# Patient Record
Sex: Female | Born: 1985 | Race: White | Hispanic: Yes | Marital: Married | Smoking: Never smoker
Health system: Southern US, Community
[De-identification: ages and names within clinical notes are randomized; demographics above are authoritative.]

## PROBLEM LIST (undated history)

## (undated) ENCOUNTER — Ambulatory Visit: Admission: EM | Payer: Self-pay

---

## 2003-02-17 ENCOUNTER — Other Ambulatory Visit: Admission: RE | Admit: 2003-02-17 | Discharge: 2003-02-17 | Payer: Self-pay | Admitting: Obstetrics and Gynecology

## 2003-06-28 ENCOUNTER — Inpatient Hospital Stay (HOSPITAL_COMMUNITY): Admission: AD | Admit: 2003-06-28 | Discharge: 2003-06-28 | Payer: Self-pay | Admitting: *Deleted

## 2003-07-05 ENCOUNTER — Encounter: Admission: RE | Admit: 2003-07-05 | Discharge: 2003-07-05 | Payer: Self-pay | Admitting: *Deleted

## 2003-07-07 ENCOUNTER — Encounter (INDEPENDENT_AMBULATORY_CARE_PROVIDER_SITE_OTHER): Payer: Self-pay | Admitting: Specialist

## 2003-07-07 ENCOUNTER — Inpatient Hospital Stay (HOSPITAL_COMMUNITY): Admission: AD | Admit: 2003-07-07 | Discharge: 2003-07-09 | Payer: Self-pay | Admitting: Obstetrics & Gynecology

## 2006-01-06 ENCOUNTER — Ambulatory Visit: Payer: Self-pay | Admitting: Family Medicine

## 2006-01-07 ENCOUNTER — Ambulatory Visit: Payer: Self-pay | Admitting: *Deleted

## 2006-01-20 ENCOUNTER — Ambulatory Visit: Payer: Self-pay | Admitting: Family Medicine

## 2006-02-18 ENCOUNTER — Ambulatory Visit: Payer: Self-pay | Admitting: Family Medicine

## 2006-05-06 ENCOUNTER — Encounter (INDEPENDENT_AMBULATORY_CARE_PROVIDER_SITE_OTHER): Payer: Self-pay | Admitting: Family Medicine

## 2006-05-06 ENCOUNTER — Ambulatory Visit: Payer: Self-pay | Admitting: Family Medicine

## 2006-12-31 ENCOUNTER — Encounter (INDEPENDENT_AMBULATORY_CARE_PROVIDER_SITE_OTHER): Payer: Self-pay | Admitting: *Deleted

## 2007-04-22 ENCOUNTER — Ambulatory Visit: Payer: Self-pay | Admitting: Internal Medicine

## 2007-05-13 ENCOUNTER — Ambulatory Visit: Payer: Self-pay | Admitting: Family Medicine

## 2007-05-13 ENCOUNTER — Encounter: Payer: Self-pay | Admitting: Family Medicine

## 2007-08-10 ENCOUNTER — Ambulatory Visit: Payer: Self-pay | Admitting: Internal Medicine

## 2007-08-11 ENCOUNTER — Encounter: Payer: Self-pay | Admitting: Family Medicine

## 2007-08-12 ENCOUNTER — Ambulatory Visit: Payer: Self-pay | Admitting: Family Medicine

## 2007-10-05 ENCOUNTER — Encounter: Payer: Self-pay | Admitting: Family Medicine

## 2007-10-05 ENCOUNTER — Ambulatory Visit: Payer: Self-pay | Admitting: Internal Medicine

## 2007-10-05 LAB — CONVERTED CEMR LAB
AST: 20 units/L (ref 0–37)
Alkaline Phosphatase: 66 units/L (ref 39–117)
BUN: 17 mg/dL (ref 6–23)
Basophils Relative: 0 % (ref 0–1)
Eosinophils Absolute: 0.1 10*3/uL (ref 0.0–0.7)
Eosinophils Relative: 1 % (ref 0–5)
Glucose, Bld: 99 mg/dL (ref 70–99)
HCT: 42.8 % (ref 36.0–46.0)
HDL: 40 mg/dL (ref >39)
LDL Cholesterol: 63 mg/dL (ref 0–99)
Lymphs Abs: 2 10*3/uL (ref 0.7–4.0)
MCHC: 30.8 g/dL (ref 30.0–36.0)
MCV: 87.7 fL (ref 78.0–100.0)
Monocytes Relative: 6 % (ref 3–12)
Platelets: 214 10*3/uL (ref 150–400)
RBC: 4.88 M/uL (ref 3.87–5.11)
Total Bilirubin: 0.7 mg/dL (ref 0.3–1.2)
Total CHOL/HDL Ratio: 3.2
Triglycerides: 124 mg/dL (ref <150)
VLDL: 25 mg/dL (ref 0–40)
WBC: 8.7 10*3/uL (ref 4.0–10.5)

## 2007-10-12 ENCOUNTER — Ambulatory Visit: Payer: Self-pay | Admitting: Internal Medicine

## 2007-10-19 ENCOUNTER — Ambulatory Visit (HOSPITAL_COMMUNITY): Admission: RE | Admit: 2007-10-19 | Discharge: 2007-10-19 | Payer: Self-pay | Admitting: Family Medicine

## 2007-12-24 ENCOUNTER — Emergency Department (HOSPITAL_COMMUNITY): Admission: EM | Admit: 2007-12-24 | Discharge: 2007-12-24 | Payer: Self-pay | Admitting: Family Medicine

## 2008-02-08 ENCOUNTER — Ambulatory Visit: Payer: Self-pay | Admitting: Internal Medicine

## 2008-10-09 ENCOUNTER — Emergency Department (HOSPITAL_COMMUNITY): Admission: EM | Admit: 2008-10-09 | Discharge: 2008-10-09 | Payer: Self-pay | Admitting: Emergency Medicine

## 2008-10-09 ENCOUNTER — Emergency Department (HOSPITAL_COMMUNITY): Admission: EM | Admit: 2008-10-09 | Discharge: 2008-10-09 | Payer: Self-pay | Admitting: Family Medicine

## 2009-01-02 ENCOUNTER — Encounter: Payer: Self-pay | Admitting: Family Medicine

## 2009-01-02 ENCOUNTER — Ambulatory Visit: Payer: Self-pay | Admitting: Internal Medicine

## 2009-01-02 LAB — CONVERTED CEMR LAB: Chlamydia, DNA Probe: NEGATIVE

## 2009-04-11 ENCOUNTER — Ambulatory Visit: Payer: Self-pay | Admitting: Internal Medicine

## 2009-05-10 ENCOUNTER — Ambulatory Visit: Payer: Self-pay | Admitting: Internal Medicine

## 2009-05-22 ENCOUNTER — Ambulatory Visit: Payer: Self-pay | Admitting: Internal Medicine

## 2010-05-07 ENCOUNTER — Encounter: Payer: Self-pay | Admitting: Family Medicine

## 2010-07-23 LAB — URINALYSIS, ROUTINE W REFLEX MICROSCOPIC
Bilirubin Urine: NEGATIVE
Glucose, UA: NEGATIVE mg/dL
Ketones, ur: 15 mg/dL — AB
Leukocytes, UA: NEGATIVE
Specific Gravity, Urine: 1.036 — ABNORMAL HIGH (ref 1.005–1.030)
pH: 7 (ref 5.0–8.0)

## 2010-07-23 LAB — POCT URINALYSIS DIP (DEVICE)
Bilirubin Urine: NEGATIVE
Glucose, UA: NEGATIVE mg/dL
Nitrite: NEGATIVE
pH: 7 (ref 5.0–8.0)

## 2010-07-23 LAB — URINE MICROSCOPIC-ADD ON

## 2010-07-23 LAB — GC/CHLAMYDIA PROBE AMP, GENITAL: Chlamydia, DNA Probe: NEGATIVE

## 2010-07-23 LAB — WET PREP, GENITAL: Trich, Wet Prep: NONE SEEN

## 2010-11-30 IMAGING — CT CT PELVIS W/O CM
2 of 4 series · 14 of 32 positions shown, 19 images · non-contrast
Comparison: None

CT ABDOMEN

CLINICAL DATA: Right flank pain.

CT ABDOMEN AND PELVIS WITHOUT CONTRAST
TECHNIQUE: Multidetector CT imaging of the abdomen and pelvis was
performed following the standard protocol without intravenous
contrast.

[Series 2: >200 lbs stone · axial · 0.79mm/px · z∈[-391,-41]mm · 8 of 91 slices shown, 13 images]
[im 11/91  soft-tissue]
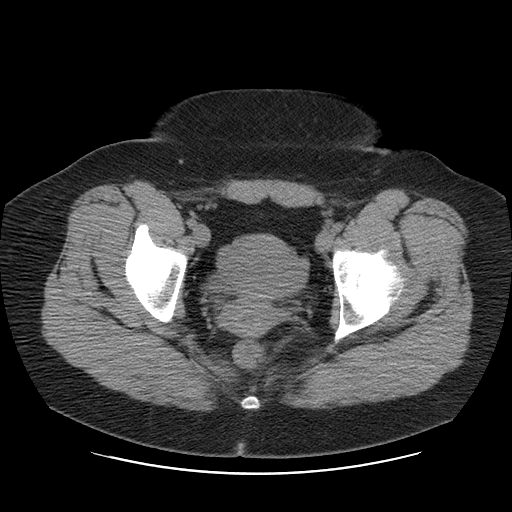
[im 11/91  bone]
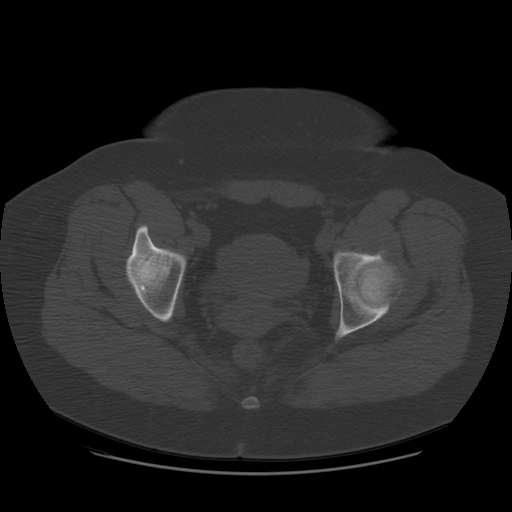
[im 21/91  soft-tissue]
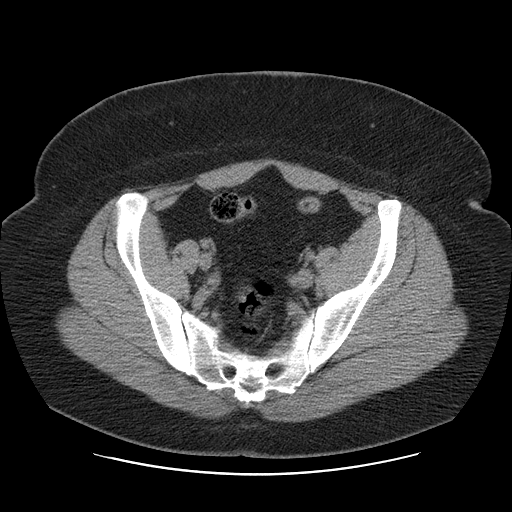
[im 31/91  soft-tissue]
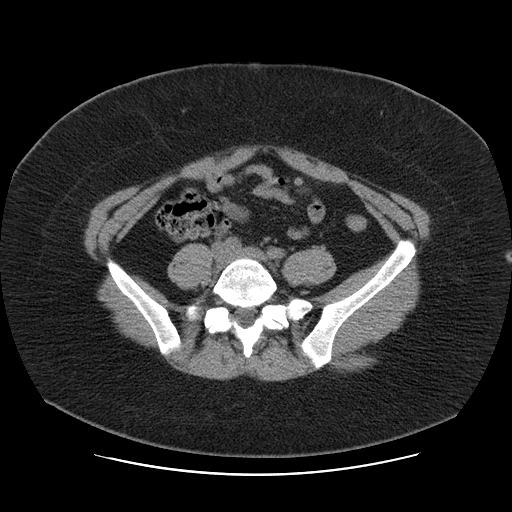
[im 41/91  soft-tissue]
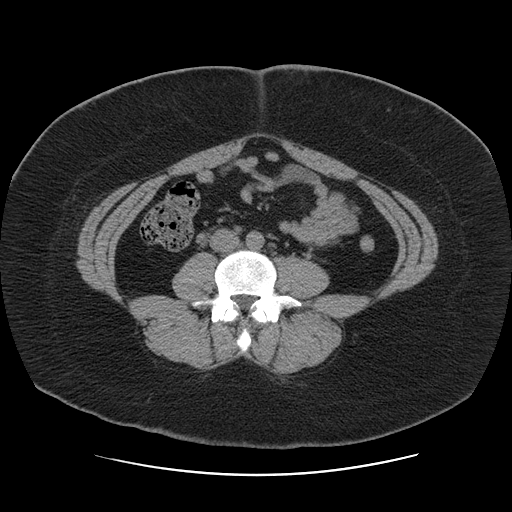
[im 51/91  soft-tissue]
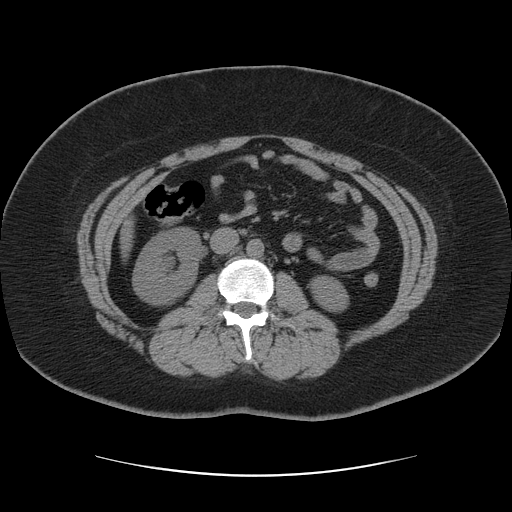
[im 51/91  lung]
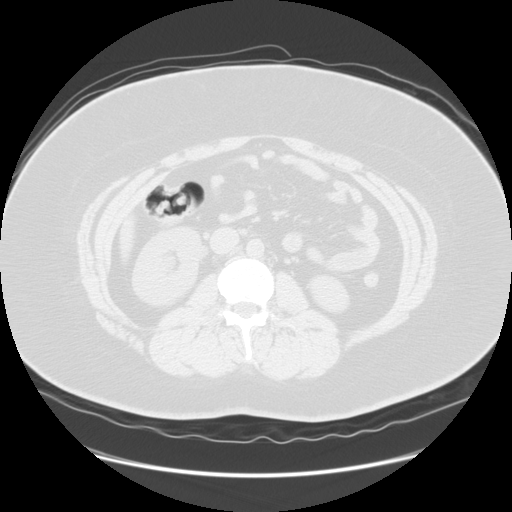
[im 61/91  soft-tissue]
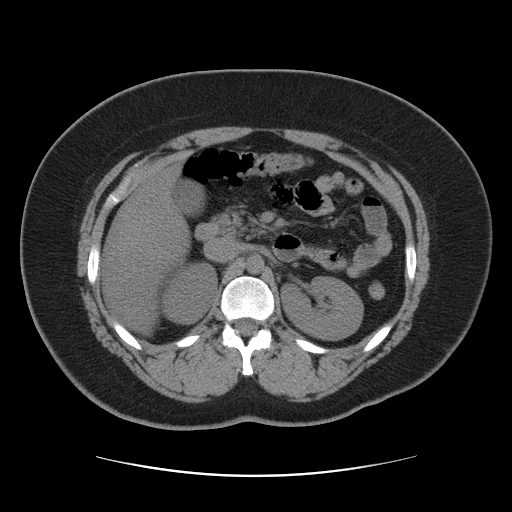
[im 61/91  lung]
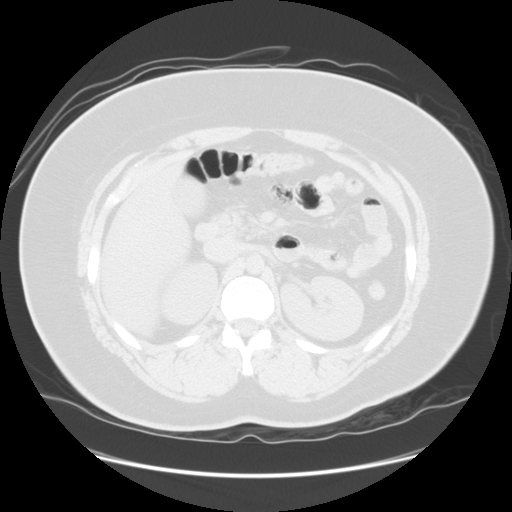
[im 71/91  soft-tissue]
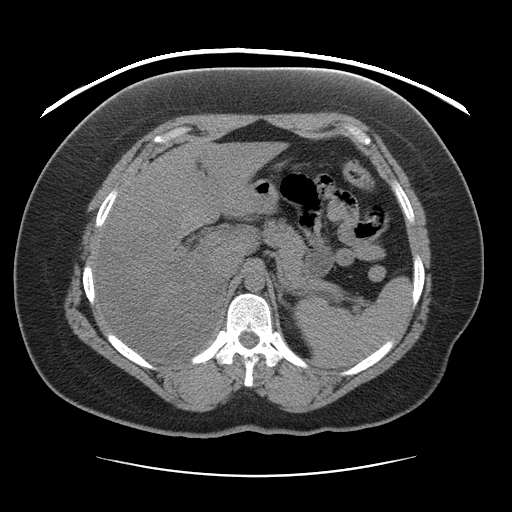
[im 71/91  lung]
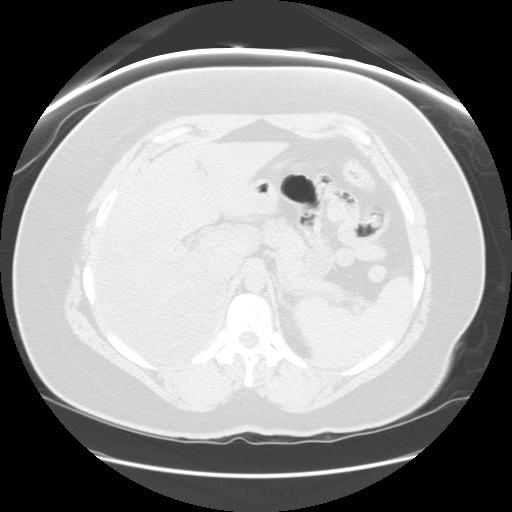
[im 81/91  soft-tissue]
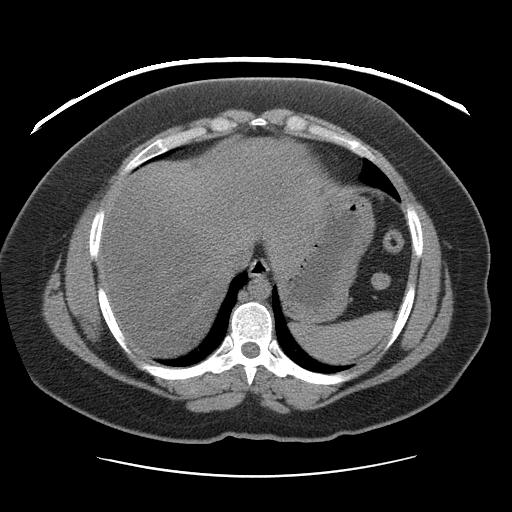
[im 81/91  lung]
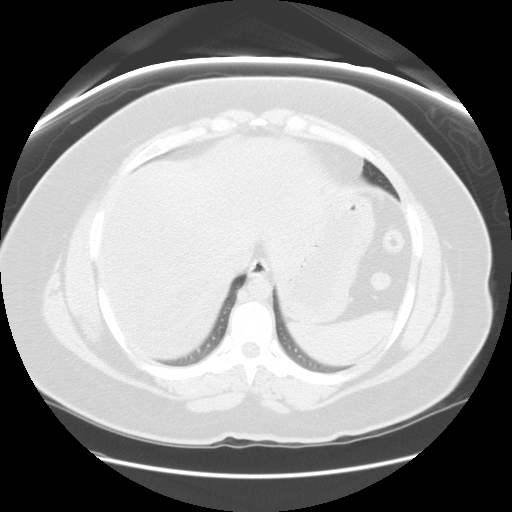

[Series 400: sag · sagittal · 0.91mm/px · 6 of 104 slices shown]
[im 11/104  soft-tissue]
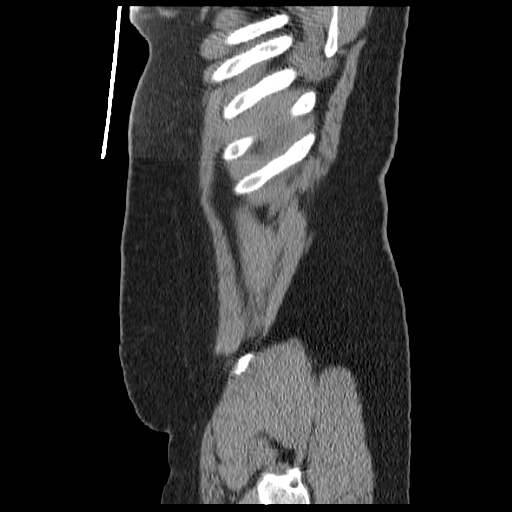
[im 21/104  soft-tissue]
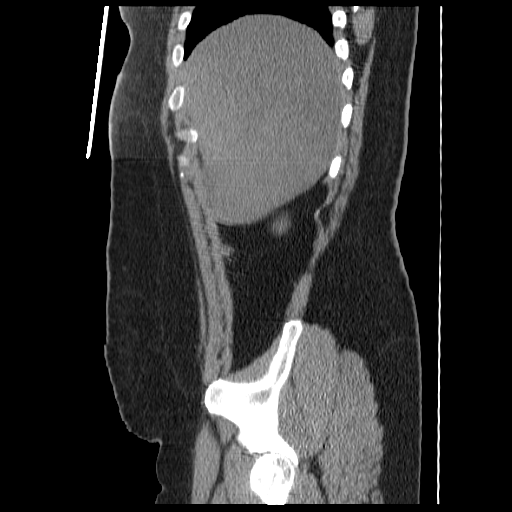
[im 31/104  soft-tissue]
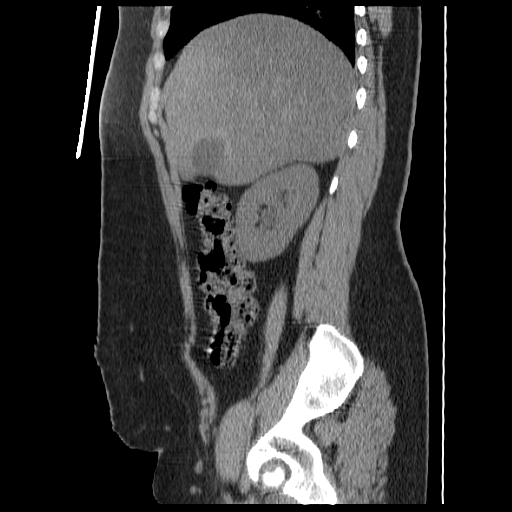
[im 42/104  soft-tissue]
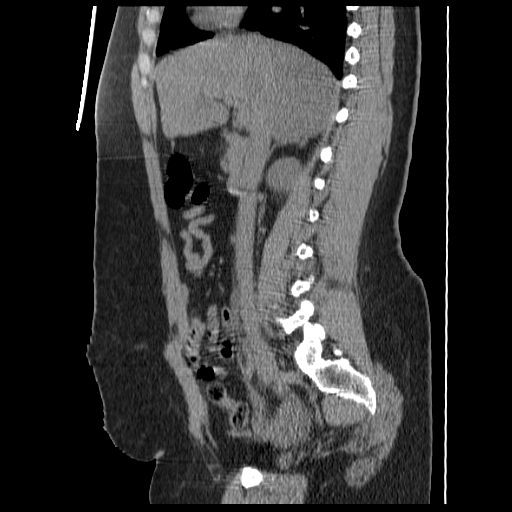
[im 62/104  soft-tissue]
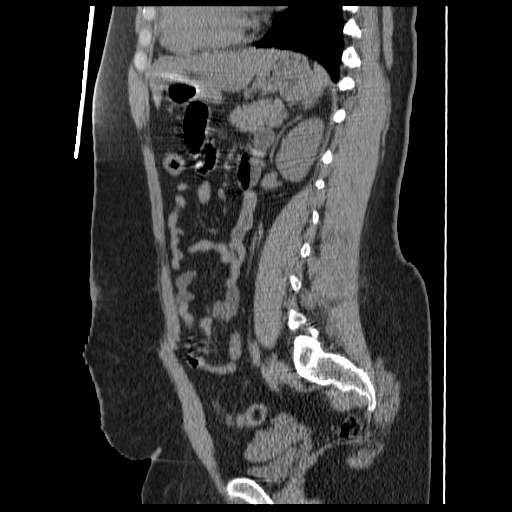
[im 73/104  soft-tissue]
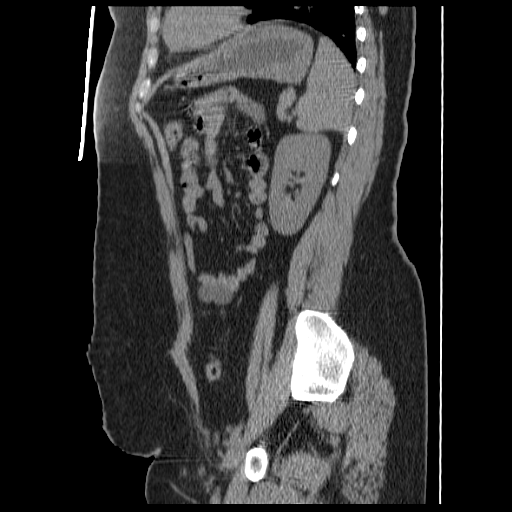

[14 of 32 positions shown; findings below may reference images not displayed]

FINDINGS: The lung bases are clear.  There is geographic fatty
infiltration of the liver.  The spleen is normal in size.  The
pancreas is unremarkable.  The adrenal glands and left kidney are
normal.  The right kidney demonstrates moderate hydronephrosis and
perinephric signs of obstruction.  Small right renal calculi are
noted.

The stomach, duodenum, small bowel and colon grossly normal.  The
study is limited without oral contrast.  No mesenteric or
retroperitoneal masses or adenopathy.  The bony structures are
unremarkable.
IMPRESSION: 1.  Moderate to high-grade obstructive findings involving the right
kidney.  No upper ureteral calculi are seen.
2.  Small lower pole right renal calculus.
3.  Geographic fatty infiltration of the liver.

CT PELVIS
FINDINGS: There is a 5 x 3 mm right distal ureteral calculus
causing the right-sided hydroureteronephrosis.  No bladder calculi.
The uterus and ovaries are unremarkable.  The appendix is
visualized and is normal.  No pelvic mass or adenopathy.  Small
inguinal lymph nodes bilaterally.  The bony pelvis is intact.
IMPRESSION: 5 x 3 mm right distal ureteral calculus causing right-sided
hydroureteronephrosis.

## 2011-01-16 LAB — POCT URINALYSIS DIP (DEVICE)
Glucose, UA: NEGATIVE
Nitrite: NEGATIVE
Operator id: 239701
Protein, ur: NEGATIVE
Specific Gravity, Urine: 1.015
Urobilinogen, UA: 0.2

## 2015-05-08 ENCOUNTER — Ambulatory Visit: Payer: Self-pay

## 2019-12-03 ENCOUNTER — Encounter: Payer: Self-pay | Admitting: Emergency Medicine

## 2019-12-03 ENCOUNTER — Emergency Department
Admission: EM | Admit: 2019-12-03 | Discharge: 2019-12-03 | Disposition: A | Discharge status: Home / Self Care | Payer: Self-pay | Source: Home / Self Care | Attending: Family Medicine | Admitting: Family Medicine

## 2019-12-03 ENCOUNTER — Other Ambulatory Visit: Payer: Self-pay

## 2019-12-03 DIAGNOSIS — S61012A Laceration without foreign body of left thumb without damage to nail, initial encounter: Secondary | ICD-10-CM

## 2019-12-03 NOTE — Discharge Instructions (Addendum)
Keep wound clean and dry.  Return for any signs of infection (or follow-up with family doctor):  Increasing redness, swelling, pain, heat, drainage, etc. °Follow instructions on Dermabond information sheet.  °

## 2019-12-03 NOTE — ED Provider Notes (Signed)
[facility]    CSN: [id number] Arrival date & time: 12/03/19  0909      History   Chief Complaint Chief Complaint  Patient presents with  . Extremity Laceration    HPI Eulia Hatcher is a 34 y.o. female.   While washing dishes today patient lacerated the tip of her right thumb with a knife.  The history is provided by the patient. The history is limited by a language barrier. A language interpreter was used.  Laceration Location:  Finger Finger laceration location:  R thumb Length:  1.5cm Depth:  Cutaneous Quality: straight   Bleeding: controlled   Time since incident:  2 hours Laceration mechanism:  Knife Pain details:    Quality:  Aching   Severity:  Mild   Timing:  Constant   Progression:  Improving Foreign body present:  No foreign bodies Worsened by:  Nothing Ineffective treatments:  None tried Associated symptoms: no numbness and no swelling     History reviewed. No pertinent past medical history.  There are no problems to display for this patient.   History reviewed. No pertinent surgical history.  OB History   No obstetric history on file.      Home Medications    Prior to Admission medications   Not on File    Family History No family history on file.  Social History Social History   Tobacco Use  . Smoking status: Never Smoker  . Smokeless tobacco: Never Used  Substance Use Topics  . Alcohol use: Not on file  . Drug use: Not on file     Allergies   Patient has no known allergies.   Review of Systems Review of Systems  Skin: Positive for wound.  All other systems reviewed and are negative.    Physical Exam Triage Vital Signs ED Triage Vitals  Enc Vitals Group     BP 12/03/19 0927 129/83     Pulse Rate 12/03/19 0927 68     Resp 12/03/19 0927 16     Temp 12/03/19 0927 98.2 F (36.8 C)     Temp Source 12/03/19 0927 Oral     SpO2 12/03/19 0927 99 %     Weight --      Height --      Head Circumference  --      Peak Flow --      Pain Score 12/03/19 0928 7     Pain Loc --      Pain Edu? --      Excl. in GC? --    No data found.  Updated Vital Signs BP 129/83   Pulse 68   Temp 98.2 F (36.8 C) (Oral)   Resp 16   LMP 11/27/2019   SpO2 99%   Visual Acuity Right Eye Distance:   Left Eye Distance:   Bilateral Distance:    Right Eye Near:   Left Eye Near:    Bilateral Near:     Physical Exam Vitals and nursing note reviewed.  Constitutional:      General: She is not in acute distress. HENT:     Head: Normocephalic.  Eyes:     Pupils: Pupils are equal, round, and reactive to light.  Cardiovascular:     Rate and Rhythm: Normal rate.  Pulmonary:     Effort: Pulmonary effort is normal.  Musculoskeletal:        General: No swelling.       Hands:     Comments: Left  thumb tip has a 1.5cm superficial deep scratch as noted on diagram.  Thumb has full range of motion all joints.  Skin:    General: Skin is warm and dry.  Neurological:     Mental Status: She is alert.      UC Treatments / Results  Labs (all labs ordered are listed, but only abnormal results are displayed) Labs Reviewed - No data to display  EKG   Radiology No results found.  Procedures Procedures Laceration Repair (Dermabond); wound very superficial and no sutures necessary. Discussed benefits and risks of procedure and verbal consent obtained. Using sterile technique, cleansed wound with Hibiclens followed by copious lavage with normal saline.  Wound carefully inspected for debris and foreign bodies; none found.  Wound edges carefully approximated in normal anatomic position and closed with Dermabond.  Wound precautions explained to patient.     Medications Ordered in UC Medications - No data to display  Initial Impression / Assessment and Plan / UC Course  I have reviewed the triage vital signs and the nursing notes.  Pertinent labs & imaging results that were available during my care of the  patient were reviewed by me and considered in my medical decision making (see chart for details).    Wound on right thumb is essentially a deep scratch.  Repaired with Dermabond to speed healing.   Final Clinical Impressions(s) / UC Diagnoses   Final diagnoses:  Laceration of left thumb without foreign body without damage to nail, initial encounter     Discharge Instructions     Keep wound clean and dry.  Return for any signs of infection (or follow-up with family doctor):  Increasing redness, swelling, pain, heat, drainage, etc. Follow instructions on Dermabond information sheet.     ED Prescriptions    None        Lattie Haw, MD 12/06/19 2009

## 2019-12-03 NOTE — ED Triage Notes (Signed)
PT cut right thumb with a knife while cleaning today. She is able to bend thumb

## 2019-12-03 NOTE — ED Notes (Addendum)
Left thumb soaked in hibiclens

## 2024-01-07 ENCOUNTER — Ambulatory Visit
Admission: EM | Admit: 2024-01-07 | Discharge: 2024-01-07 | Disposition: A | Discharge status: Home / Self Care | Payer: Self-pay | Attending: Physician Assistant | Admitting: Physician Assistant

## 2024-01-07 ENCOUNTER — Other Ambulatory Visit: Payer: Self-pay

## 2024-01-07 DIAGNOSIS — S67192A Crushing injury of right middle finger, initial encounter: Secondary | ICD-10-CM

## 2024-01-07 DIAGNOSIS — S6010XA Contusion of unspecified finger with damage to nail, initial encounter: Secondary | ICD-10-CM

## 2024-01-07 NOTE — ED Triage Notes (Addendum)
 Pt is accompanied by daughter on today's visit. Daughter to translate. IPAD translator offered, pt denied.   Pt presents with a chief complaint of right middle finger injury. States she was loading granite onto a dolly (cart) and her finger got in the way. Currently rates overall pain an 8/10. Describes as pulsating. OTC Tylenol taken at 4 PM with minimal to no relief. X-ray only if needed. Main concern is the nailbed, it is hanging on.

## 2024-01-07 NOTE — Discharge Instructions (Addendum)
 You were seen today for concerns of right middle finger injury and nail damage.  We performed something called a trephination to your nail to help relieve the pressure and discomfort from blood underneath the nail.  You have intact range of motion of your finger and there is minimal swelling and bruising so I am less concerned for a fracture or injury to the bone.  I recommend performing warm water Epsom salt baths for your finger to help with drainage and discomfort.  You can also alternate Tylenol and ibuprofen as needed for pain control. Please keep your finger clean with warm water and gentle soap and then pat it to dry.  To manage it I recommend using a small amount of bacitracin and then covering it with gauze and some Coban.  You can also use a Band-Aid.  If you notice any of the following symptoms please return to urgent care or follow-up with a hand specialist at orthopedics: Significant swelling or bruising of the finger, difficulty bending or extending your finger, instability or peeling of the nail, the nail seems like it is going to fall off.

## 2024-01-07 NOTE — ED Provider Notes (Signed)
 [facility]    CSN: [id number] Arrival date & time: 01/07/24  1814      History   Chief Complaint Chief Complaint  Patient presents with   Finger Injury    HPI Alexis Merritt is a 38 y.o. female.   HPI  Pt is here with her daughter who is providing translation services  They report the patient was helping to move a slab of granite this AM and during that process her finger was semi-stuck underneath. She tried to extricate it and her nail was damaged. Injury occurred to the right middle finger.  She reports mild pain with bending the finger  She is not sure if it is still bleeding as it is wrapped up currenlty but there was bleeding initially. She is concerned for the nail being ripped off    History reviewed. No pertinent past medical history.  There are no active problems to display for this patient.   History reviewed. No pertinent surgical history.  OB History   No obstetric history on file.      Home Medications    Prior to Admission medications   Not on File    Family History History reviewed. No pertinent family history.  Social History Social History   Tobacco Use   Smoking status: Never   Smokeless tobacco: Never  Vaping Use   Vaping status: Never Used  Substance Use Topics   Alcohol use: Never   Drug use: Never     Allergies   Patient has no known allergies.   Review of Systems Review of Systems  Musculoskeletal:        Finger injury      Physical Exam Triage Vital Signs ED Triage Vitals  Encounter Vitals Group     BP 01/07/24 1905 (!) 148/87     Girls Systolic BP Percentile --      Girls Diastolic BP Percentile --      Boys Systolic BP Percentile --      Boys Diastolic BP Percentile --      Pulse Rate 01/07/24 1905 62     Resp 01/07/24 1905 17     Temp 01/07/24 1905 98.1 F (36.7 C)     Temp Source 01/07/24 1905 Oral     SpO2 01/07/24 1905 99 %     Weight 01/07/24 1905 215 lb (97.5 kg)     Height  01/07/24 1905 5' 3 (1.6 m)     Head Circumference --      Peak Flow --      Pain Score 01/07/24 1920 8     Pain Loc --      Pain Education --      Exclude from Growth Chart --    No data found.  Updated Vital Signs BP (!) 148/87 (BP Location: Right Arm)   Pulse 62   Temp 98.1 F (36.7 C) (Oral)   Resp 17   Ht 5' 3 (1.6 m)   Wt 215 lb (97.5 kg)   LMP 01/05/2024 (Exact Date)   SpO2 99%   BMI 38.09 kg/m   Visual Acuity Right Eye Distance:   Left Eye Distance:   Bilateral Distance:    Right Eye Near:   Left Eye Near:    Bilateral Near:     Physical Exam Vitals reviewed.  Constitutional:      General: She is awake.     Appearance: Normal appearance. She is well-developed and well-groomed.  HENT:     Head:  Normocephalic and atraumatic.  Eyes:     General: Lids are normal. Gaze aligned appropriately.     Extraocular Movements: Extraocular movements intact.     Conjunctiva/sclera: Conjunctivae normal.  Pulmonary:     Effort: Pulmonary effort is normal.  Musculoskeletal:       Hands:     Comments: She has intact range of motion with regards to flexion extension of the fingers of the right hand.  She is mildly hesitant to fully flex the right middle finger but is able to do so. Capillary refill is 2 to 3 seconds in all fingers and does not appear decreased at the distal aspect of the right middle finger.  Neurological:     Mental Status: She is alert and oriented to person, place, and time.  Psychiatric:        Attention and Perception: Attention and perception normal.        Mood and Affect: Mood and affect normal.        Speech: Speech normal.        Behavior: Behavior normal. Behavior is cooperative.      UC Treatments / Results  Labs (all labs ordered are listed, but only abnormal results are displayed) Labs Reviewed - No data to display  EKG   Radiology No results found.  Procedures Incision and Drainage  Date/Time: 01/07/2024 8:30  PM  Performed by: Marylene Rocky BRAVO, PA-C Authorized by: Marylene Rocky BRAVO, PA-C   Consent:    Consent obtained:  Verbal   Consent given by:  Patient   Risks, benefits, and alternatives were discussed: yes     Risks discussed:  Bleeding, incomplete drainage, pain and infection   Alternatives discussed:  No treatment, alternative treatment and observation Universal protocol:    Procedure explained and questions answered to patient or proxy's satisfaction: yes     Patient identity confirmed:  Verbally with patient Location:    Type:  Subungual hematoma   Location:  Upper extremity   Upper extremity location:  Finger   Finger location:  R long finger Pre-procedure details:    Skin preparation:  Chlorhexidine and povidone-iodine Anesthesia:    Anesthesia method:  None Procedure type:    Complexity:  Simple Procedure details:    Drainage:  Bloody   Drainage amount:  Scant   Wound treatment:  Wound left open   Packing materials:  None Post-procedure details:    Procedure completion:  Tolerated well, no immediate complications  (including critical care time)  Medications Ordered in UC Medications - No data to display  Initial Impression / Assessment and Plan / UC Course  I have reviewed the triage vital signs and the nursing notes.  Pertinent labs & imaging results that were available during my care of the patient were reviewed by me and considered in my medical decision making (see chart for details).      Final Clinical Impressions(s) / UC Diagnoses   Final diagnoses:  Subungual hematoma of finger of right hand, initial encounter  Crushing injury of right middle finger, initial encounter   Patient presents today with crushing injury of right middle finger that occurred earlier today.  She has concerns for the nail of the right middle finger as this area is bruised and she feels like there is a lot of pressure there.  She also has a small laceration just proximal to the nailbed of  this finger.  Physical exam reveals intact range of motion with regards to flexion extension of the  middle finger.  Capillary refill is 2 to 3 seconds but appears consistent with other fingers of the right hand that are not affected by the injury.  Discussed with patient that we can perform trephination of the nail to assist with pressure and to relieve the subungual hematoma.  Discussed risk, benefits, potential complications of the procedure with patient and her daughter who provided translation.  Patient agrees to the procedure which was carried out without complications.  At this time given intact range of motion, minimal swelling or bruising to the area I do not think that there is a likely fracture underlying the superficial injury.  Reviewed signs and symptoms that would require further follow-up and imaging with patient.  Reviewed home measures to assist with preventing infection.  ED and return precautions reviewed and provided in AVS.  Follow-up as needed.    Discharge Instructions      You were seen today for concerns of right middle finger injury and nail damage.  We performed something called a trephination to your nail to help relieve the pressure and discomfort from blood underneath the nail.  You have intact range of motion of your finger and there is minimal swelling and bruising so I am less concerned for a fracture or injury to the bone.  I recommend performing warm water Epsom salt baths for your finger to help with drainage and discomfort.  You can also alternate Tylenol and ibuprofen as needed for pain control. Please keep your finger clean with warm water and gentle soap and then pat it to dry.  To manage it I recommend using a small amount of bacitracin and then covering it with gauze and some Coban.  You can also use a Band-Aid.  If you notice any of the following symptoms please return to urgent care or follow-up with a hand specialist at orthopedics: Significant swelling or  bruising of the finger, difficulty bending or extending your finger, instability or peeling of the nail, the nail seems like it is going to fall off.     ED Prescriptions   None    PDMP not reviewed this encounter.   Armel Rabbani, Rocky BRAVO, PA-C 01/08/24 1340

## 2024-01-07 NOTE — ED Notes (Signed)
 Pt soaking right hand in chlorhexidine solution.

## 2024-01-07 NOTE — ED Notes (Signed)
 Bacitracin, guaze, and coban applied to right middle finger per PA order.

## 2024-01-07 NOTE — ED Notes (Signed)
 Pt was offered Science writer. Pt declined requesting her family member translate for her instead.
